# Patient Record
Sex: Female | Born: 2000 | Race: White | Hispanic: No | Marital: Single | Smoking: Never smoker
Health system: Southern US, Community
[De-identification: ages and names within clinical notes are randomized; demographics above are authoritative.]

## PROBLEM LIST (undated history)

## (undated) DIAGNOSIS — T8484XA Pain due to internal orthopedic prosthetic devices, implants and grafts, initial encounter: Secondary | ICD-10-CM

## (undated) DIAGNOSIS — T8859XA Other complications of anesthesia, initial encounter: Secondary | ICD-10-CM

## (undated) DIAGNOSIS — E221 Hyperprolactinemia: Secondary | ICD-10-CM

## (undated) DIAGNOSIS — R0981 Nasal congestion: Secondary | ICD-10-CM

## (undated) DIAGNOSIS — T4145XA Adverse effect of unspecified anesthetic, initial encounter: Secondary | ICD-10-CM

## (undated) DIAGNOSIS — Z8489 Family history of other specified conditions: Secondary | ICD-10-CM

## (undated) HISTORY — DX: Hyperprolactinemia: E22.1

## (undated) HISTORY — PX: TYMPANOSTOMY TUBE PLACEMENT: SHX32

---
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
## 2019-07-27 IMAGING — CT CT HEAD W/O CM
3 series · 16 of 47 positions shown, 19 images · non-contrast
Comparison: 01/13/06

CLINICAL DATA: Headache.  Sensitivity to light

EXAM:
CT HEAD WITHOUT CONTRAST
TECHNIQUE: Contiguous axial images were obtained from the base of the skull
through the vertex without intravenous contrast.

[Series 2: head wo · axial · 0.40mm/px · z∈[+1196,+1321]mm · 10 of 31 slices shown, 13 images]
[im 3/31  brain]
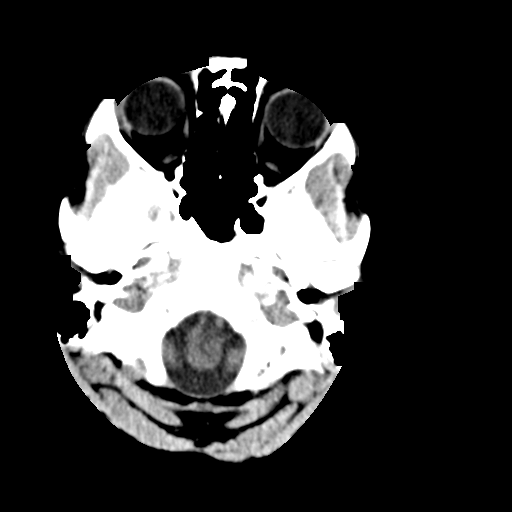
[im 3/31  bone]
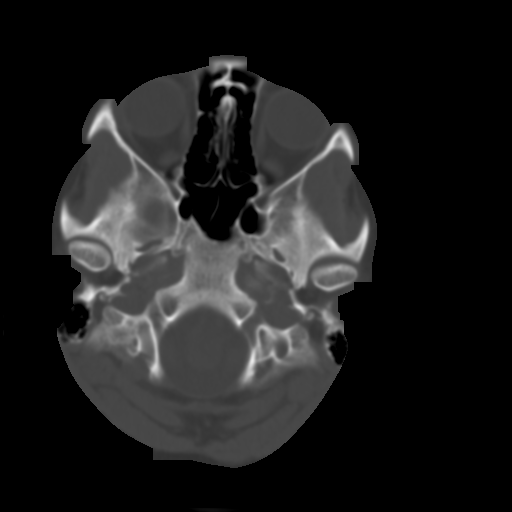
[im 6/31  brain]
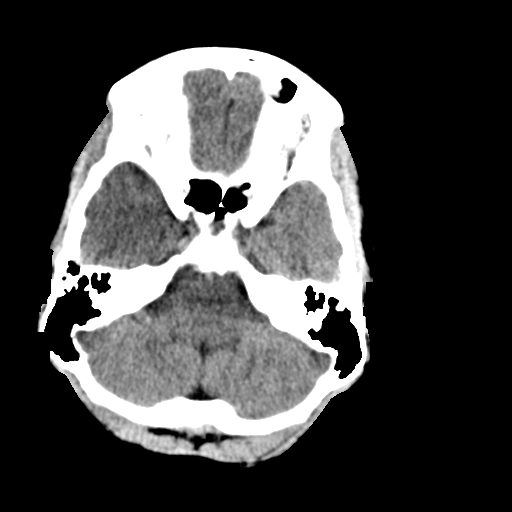
[im 9/31  brain]
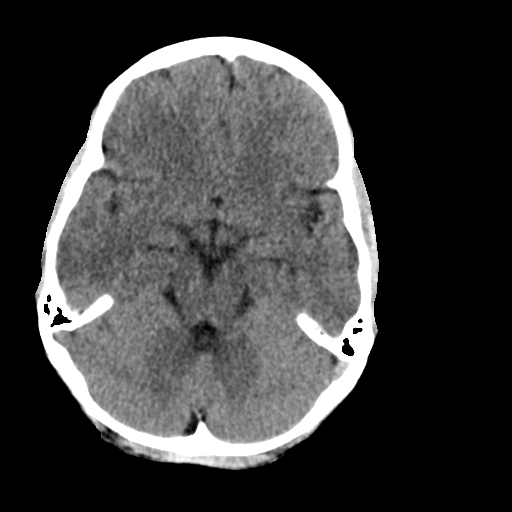
[im 11/31  brain]
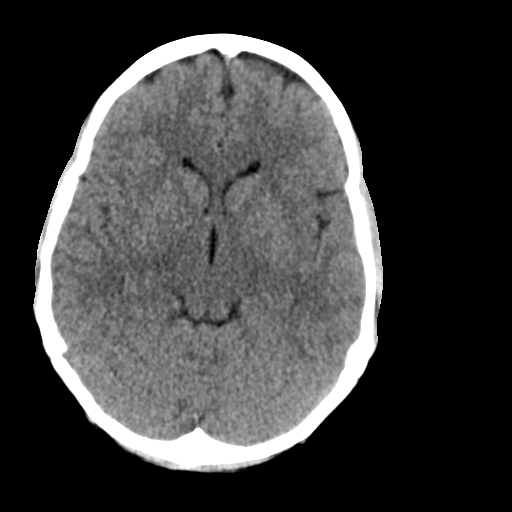
[im 14/31  brain]
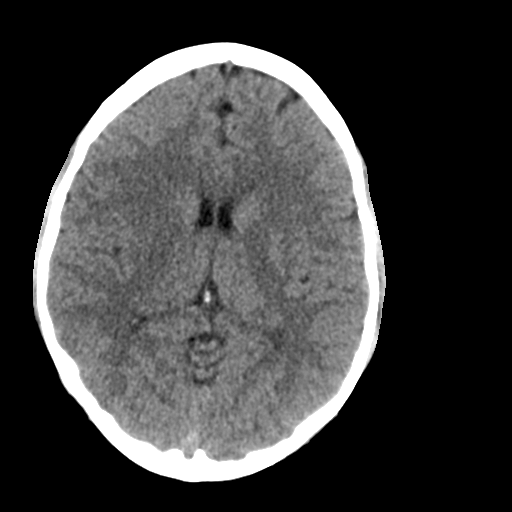
[im 14/31  bone]
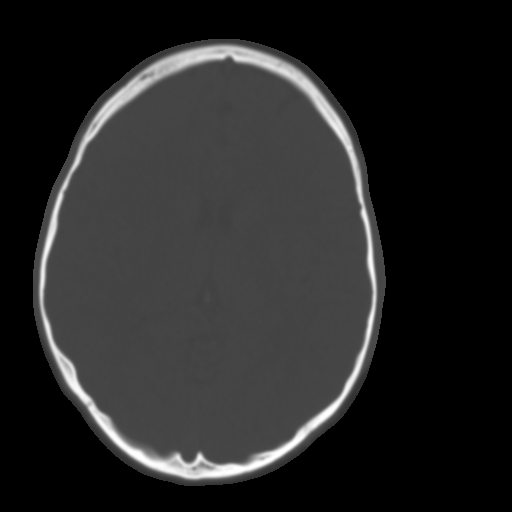
[im 17/31  brain]
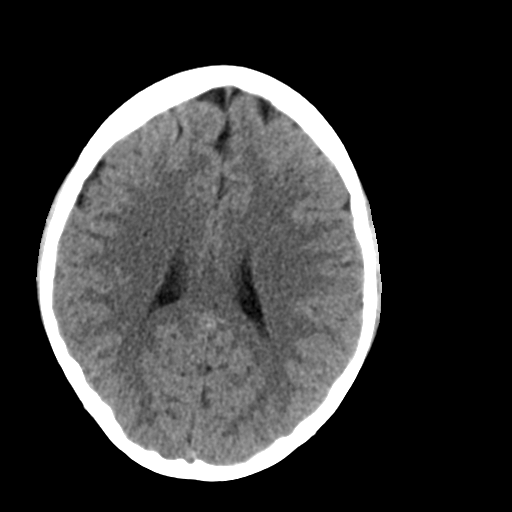
[im 20/31  brain]
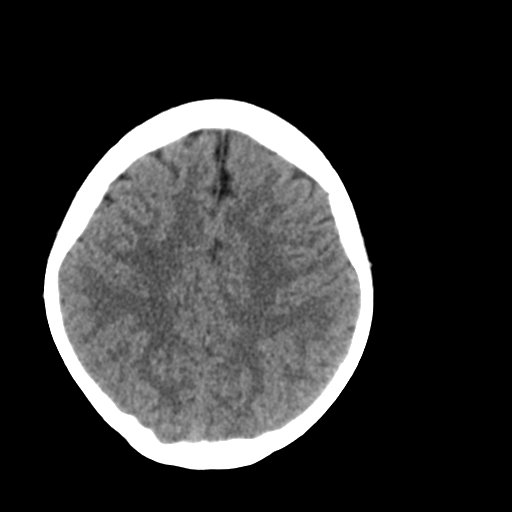
[im 23/31  brain]
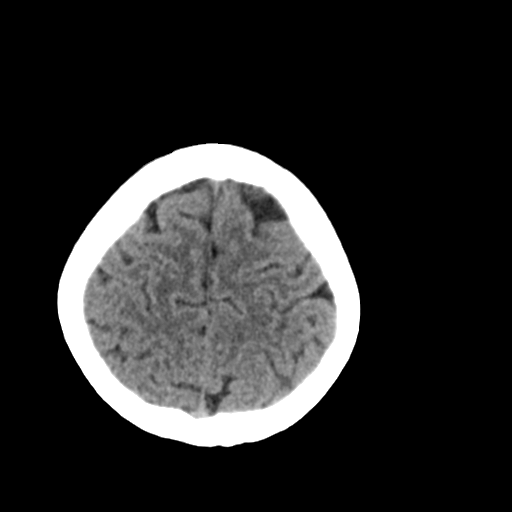
[im 25/31  brain]
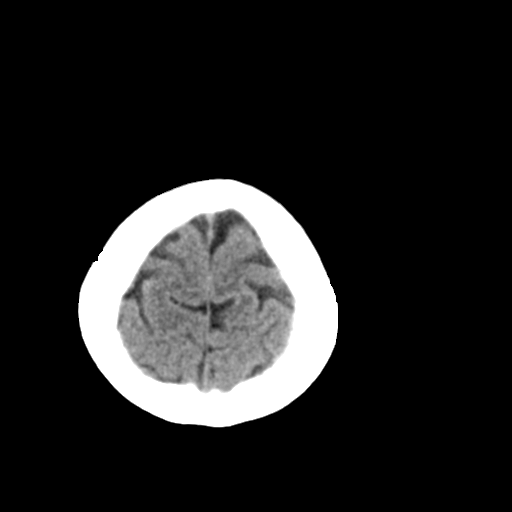
[im 25/31  bone]
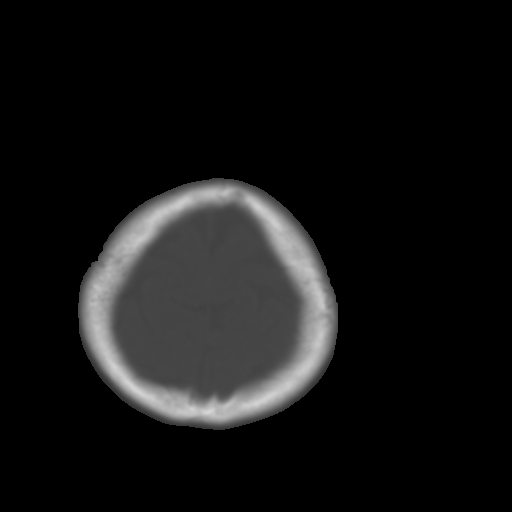
[im 28/31  brain]
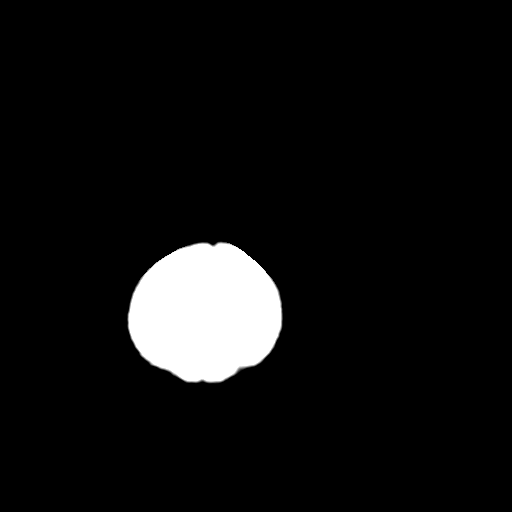

[Series 4: coronal soft tissue · coronal · 0.31mm/px · 3 of 67 slices shown]
[im 23/67  brain]
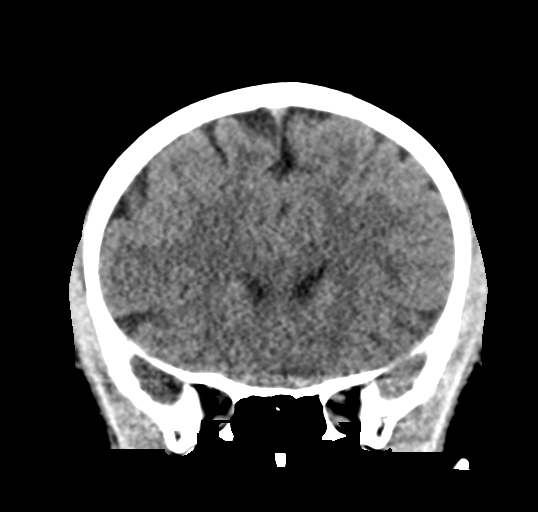
[im 30/67  brain]
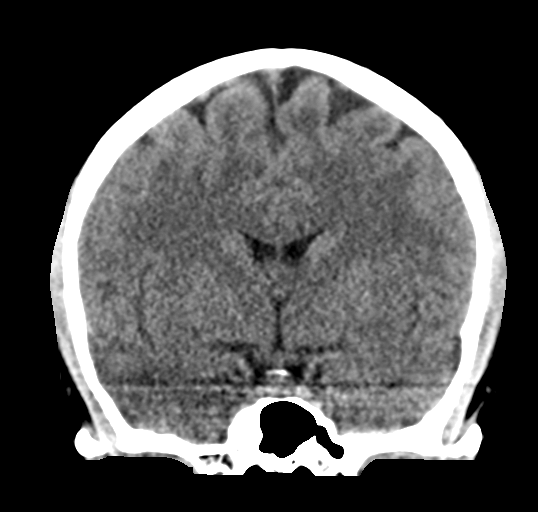
[im 37/67  brain]
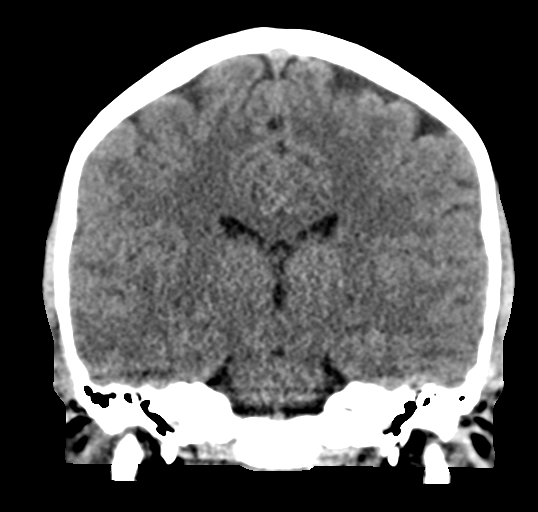

[Series 5: sagittal soft tissue · sagittal · 0.29mm/px · 3 of 57 slices shown]
[im 19/57  brain]
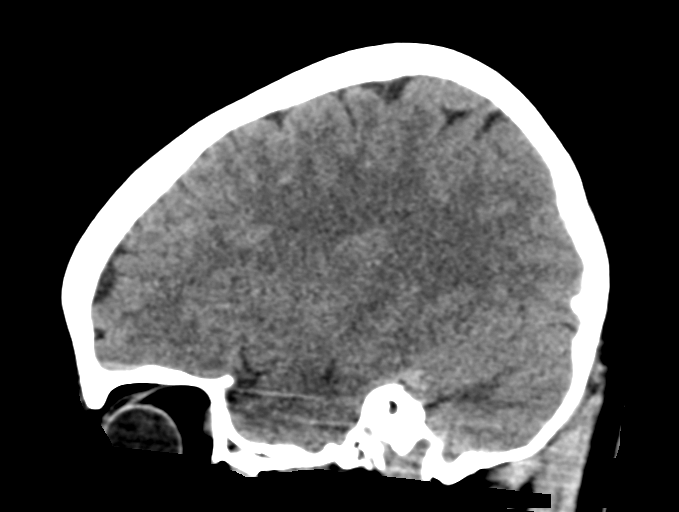
[im 29/57  brain]
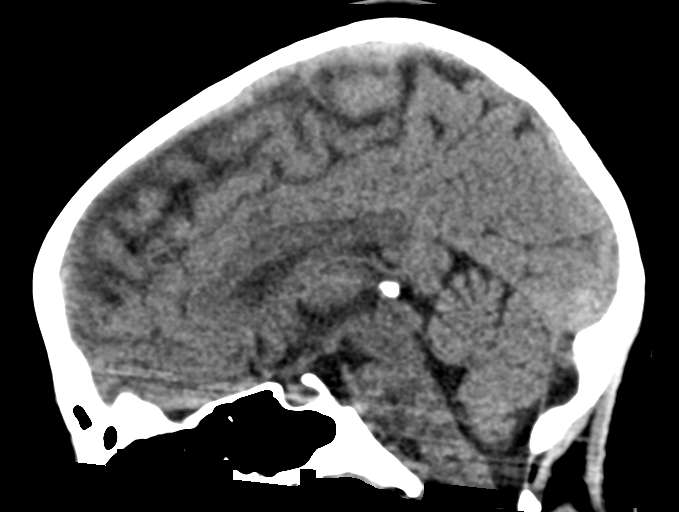
[im 38/57  brain]
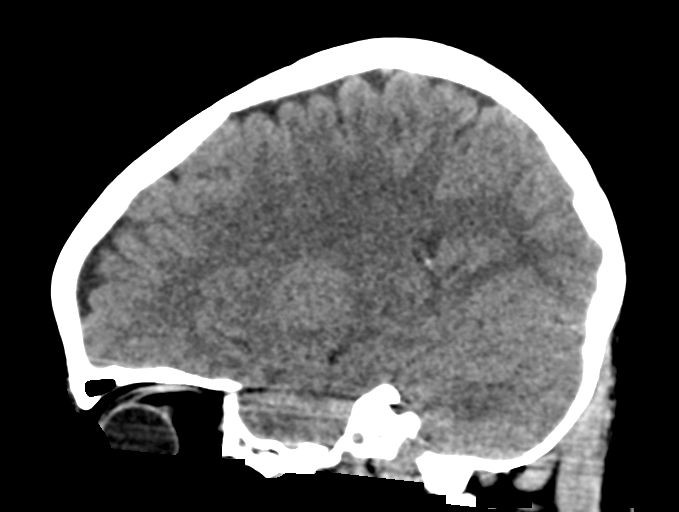

[16 of 47 positions shown; findings below may reference images not displayed]

FINDINGS: Brain: No evidence of acute infarction, hemorrhage, hydrocephalus,
extra-axial collection or mass lesion/mass effect.

Vascular: No hyperdense vessel or unexpected calcification.

Skull: Normal. Negative for fracture or focal lesion.

Sinuses/Orbits: No acute finding.

Other: None.
IMPRESSION: Normal brain.

## 2020-01-17 ENCOUNTER — Other Ambulatory Visit: Payer: Self-pay

## 2020-01-17 ENCOUNTER — Encounter (HOSPITAL_COMMUNITY): Payer: Self-pay

## 2020-01-17 ENCOUNTER — Emergency Department (HOSPITAL_COMMUNITY): Payer: No Typology Code available for payment source

## 2020-01-17 DIAGNOSIS — Z79899 Other long term (current) drug therapy: Secondary | ICD-10-CM | POA: Insufficient documentation

## 2020-01-17 DIAGNOSIS — R05 Cough: Secondary | ICD-10-CM | POA: Diagnosis not present

## 2020-01-17 DIAGNOSIS — R3912 Poor urinary stream: Secondary | ICD-10-CM | POA: Insufficient documentation

## 2020-01-17 DIAGNOSIS — R519 Headache, unspecified: Secondary | ICD-10-CM | POA: Diagnosis not present

## 2020-01-17 DIAGNOSIS — R091 Pleurisy: Secondary | ICD-10-CM | POA: Insufficient documentation

## 2020-01-17 DIAGNOSIS — R42 Dizziness and giddiness: Secondary | ICD-10-CM | POA: Insufficient documentation

## 2020-01-17 DIAGNOSIS — R0602 Shortness of breath: Secondary | ICD-10-CM | POA: Diagnosis present

## 2020-01-17 NOTE — ED Triage Notes (Signed)
Pt dx with covid last Tuesday. C/o shob

## 2020-01-18 ENCOUNTER — Emergency Department (HOSPITAL_COMMUNITY)
Admission: EM | Admit: 2020-01-18 | Discharge: 2020-01-18 | Disposition: A | Discharge status: Home / Self Care | Payer: No Typology Code available for payment source | Attending: Emergency Medicine | Admitting: Emergency Medicine

## 2020-01-18 DIAGNOSIS — R091 Pleurisy: Secondary | ICD-10-CM

## 2020-01-18 MED ORDER — PREDNISONE 20 MG PO TABS: ORAL_TABLET | ORAL | 0 refills | Status: DC

## 2020-01-18 MED ORDER — AEROCHAMBER PLUS FLO-VU MISC
1.0000 | Freq: Once | Status: AC
  Administered 2020-01-18: 1
  Filled 2020-01-18: qty 1

## 2020-01-18 MED ORDER — ALBUTEROL SULFATE HFA 108 (90 BASE) MCG/ACT IN AERS
4.0000 | INHALATION_SPRAY | Freq: Once | RESPIRATORY_TRACT | Status: AC
  Administered 2020-01-18: 4 via RESPIRATORY_TRACT
  Filled 2020-01-18: qty 6.7

## 2020-01-18 MED ORDER — PREDNISONE 20 MG PO TABS
60.0000 mg | ORAL_TABLET | Freq: Once | ORAL | Status: AC
  Administered 2020-01-18: 60 mg via ORAL
  Filled 2020-01-18: qty 3

## 2020-01-18 MED ORDER — KETOROLAC TROMETHAMINE 30 MG/ML IJ SOLN
15.0000 mg | Freq: Once | INTRAMUSCULAR | Status: AC
  Administered 2020-01-18: 15 mg via INTRAVENOUS
  Filled 2020-01-18: qty 1

## 2020-01-18 MED ORDER — LACTATED RINGERS IV BOLUS
1000.0000 mL | Freq: Once | INTRAVENOUS | Status: AC
  Administered 2020-01-18: 1000 mL via INTRAVENOUS

## 2020-01-18 NOTE — ED Provider Notes (Signed)
[facility]-EMERGENCY DEPT Provider Note   CSN: [id number] Arrival date & time: 01/17/20  2213     History Chief Complaint  Patient presents with  . Shortness of Breath    Sierra Olson is a 19 y.o. female.  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  At times she has had a headache.  Since that time she developed some decreased intake, lightheadedness.  Decreased urine output.  Patient just does not feel like eating she states.  No diarrhea or constipation.  No fevers today but has been as high as 102-103 since her illness.  Has some mild anterior midline chest pain associated with breathing.  Some cough that is nonproductive.     Past Medical History:  Diagnosis Date  . Complication of anesthesia    hard to wake up post-op  . Family history of adverse reaction to anesthesia    pt's mother has hx. of being hard to wake up post-op  . Painful orthopaedic hardware (HCC) 05/2017   left foot  . Stuffy nose 06/19/2017    There are no problems to display for this patient.   Past Surgical History:  Procedure Laterality Date  . BUNIONECTOMY Left 2009  . HARDWARE REMOVAL Left 06/26/2017   Procedure: HARDWARE REMOVAL left foot;  Surgeon: Toni Arthurs, MD;  Location: [facility];  Service: Orthopedics;  Laterality: Left;  . TYMPANOSTOMY TUBE PLACEMENT Bilateral    age 44     OB History   No obstetric history on file.     Family History  Problem Relation Age of Onset  . Hypothyroidism Mother   . Anesthesia problems Mother        hard to wake up post-op  . Hypertension Father   . Hyperlipidemia Father   . Diabetes Maternal Grandfather   . Diabetes Paternal Grandmother   . Congestive Heart Failure Paternal Grandfather   . Hypertension Paternal Grandfather     Social History   Tobacco Use  . Smoking status: Never Smoker  . Smokeless tobacco: Never Used  Vaping Use  .  Vaping Use: Never used  Substance Use Topics  . Alcohol use: No  . Drug use: No    Home Medications Prior to Admission medications   Medication Sig Start Date End Date Taking? Authorizing Provider  cephALEXin (KEFLEX) 500 MG capsule Take 1 capsule (500 mg total) by mouth 4 (four) times daily. 07/25/17   Margarita Grizzle, MD  naproxen (NAPROSYN) 250 MG tablet Take 1 tablet (250 mg total) by mouth 2 (two) times daily with a meal. 07/23/17   Samuel Jester, DO  ondansetron (ZOFRAN ODT) 4 MG disintegrating tablet Take 1 tablet (4 mg total) by mouth every 8 (eight) hours as needed for nausea or vomiting. 07/23/17   Samuel Jester, DO  oxyCODONE-acetaminophen (PERCOCET/ROXICET) 5-325 MG tablet Take 2 tablets by mouth every 4 (four) hours as needed for severe pain. 07/25/17   Margarita Grizzle, MD  predniSONE (DELTASONE) 20 MG tablet 3 tabs po day one, then 2 po daily x 4 days 01/18/20   Errik Mitchelle, Barbara Cower, MD  Simethicone (GAS RELIEF 80 PO) Take 1-2 capsules by mouth once as needed (for gas relief).    [provider]  tamsulosin (FLOMAX) 0.4 MG CAPS capsule Take 1 capsule (0.4 mg total) by mouth daily. 07/25/17   Margarita Grizzle, MD    Allergies    Patient has no known allergies.  Review of Systems   Review of Systems  All other systems reviewed and are negative.   Physical Exam Updated Vital Signs BP 137/83 (BP Location: Right Arm)   Pulse (!) 104   Temp 98.1 F (36.7 C) (Oral)   Resp 16   Ht 5\' 5"  (1.651 m)   Wt 63.5 kg   LMP 12/16/2019   SpO2 100%   BMI 23.30 kg/m   Physical Exam Vitals and nursing note reviewed.  Constitutional:      Appearance: She is well-developed.  HENT:     Head: Normocephalic and atraumatic.  Cardiovascular:     Rate and Rhythm: Normal rate and regular rhythm.  Pulmonary:     Effort: No respiratory distress.     Breath sounds: No stridor. No decreased breath sounds or wheezing.     Comments: Coughs with deep breathing Abdominal:     General: There is  no distension.  Musculoskeletal:     Cervical back: Normal range of motion.  Skin:    General: Skin is warm and dry.  Neurological:     General: No focal deficit present.     Mental Status: She is alert.     ED Results / Procedures / Treatments   Labs (all labs ordered are listed, but only abnormal results are displayed) Labs Reviewed - No data to display  EKG None  Radiology DG Chest 2 View  Result Date: 01/17/2020 CLINICAL DATA:  Shortness of breath EXAM: CHEST - 2 VIEW COMPARISON:  None. FINDINGS: The heart size and mediastinal contours are within normal limits. Both lungs are clear. The visualized skeletal structures are unremarkable. IMPRESSION: No active cardiopulmonary disease. Electronically Signed   By: 01/19/2020 M.D.   On: 01/17/2020 23:28    Procedures Procedures (including critical care time)  Medications Ordered in ED Medications  predniSONE (DELTASONE) tablet 60 mg (60 mg Oral Given 01/18/20 0142)  albuterol (VENTOLIN HFA) 108 (90 Base) MCG/ACT inhaler 4 puff (4 puffs Inhalation Given 01/18/20 0143)  aerochamber plus with mask device 1 each (1 each Other Given 01/18/20 0143)  lactated ringers bolus 1,000 mL (1,000 mLs Intravenous New Bag/Given 01/18/20 0141)  ketorolac (TORADOL) 30 MG/ML injection 15 mg (15 mg Intravenous Given 01/18/20 0141)    ED Course  I have reviewed the triage vital signs and the nursing notes.  Pertinent labs & imaging results that were available during my care of the patient were reviewed by me and considered in my medical decision making (see chart for details).    MDM Rules/Calculators/A&P                          Will try albuterol/pred for symptoms.  Fluids for dehydration. Doubt PE, no e/o lobar pneumonia on xr. Likely covid pleuritis. Significant improvement with treatments here.  Found to have normal EKG and chest x-ray low concern for pulmonary embolus, lobar pneumonia, myocarditis or any other emergent causes  Final  Clinical Impression(s) / ED Diagnoses Final diagnoses:  Pleurisy    Rx / DC Orders ED Discharge Orders         Ordered    predniSONE (DELTASONE) 20 MG tablet        01/18/20 0328           Geriann Lafont, 01/20/20, MD 01/18/20 [phone]

## 2020-08-10 ENCOUNTER — Emergency Department (HOSPITAL_COMMUNITY): Payer: No Typology Code available for payment source

## 2020-08-10 ENCOUNTER — Other Ambulatory Visit: Payer: Self-pay

## 2020-08-10 ENCOUNTER — Emergency Department (HOSPITAL_COMMUNITY)
Admission: EM | Admit: 2020-08-10 | Discharge: 2020-08-10 | Disposition: A | Discharge status: Home / Self Care | Payer: No Typology Code available for payment source | Attending: Emergency Medicine | Admitting: Emergency Medicine

## 2020-08-10 ENCOUNTER — Encounter (HOSPITAL_COMMUNITY): Payer: Self-pay | Admitting: *Deleted

## 2020-08-10 DIAGNOSIS — S300XXA Contusion of lower back and pelvis, initial encounter: Secondary | ICD-10-CM | POA: Diagnosis not present

## 2020-08-10 DIAGNOSIS — Y9301 Activity, walking, marching and hiking: Secondary | ICD-10-CM | POA: Diagnosis not present

## 2020-08-10 DIAGNOSIS — W109XXA Fall (on) (from) unspecified stairs and steps, initial encounter: Secondary | ICD-10-CM | POA: Insufficient documentation

## 2020-08-10 DIAGNOSIS — S93402A Sprain of unspecified ligament of left ankle, initial encounter: Secondary | ICD-10-CM | POA: Insufficient documentation

## 2020-08-10 DIAGNOSIS — S99912A Unspecified injury of left ankle, initial encounter: Secondary | ICD-10-CM | POA: Diagnosis present

## 2020-08-10 MED ORDER — KETOROLAC TROMETHAMINE 60 MG/2ML IM SOLN
60.0000 mg | Freq: Once | INTRAMUSCULAR | Status: AC
  Administered 2020-08-10: 60 mg via INTRAMUSCULAR
  Filled 2020-08-10: qty 2

## 2020-08-10 NOTE — ED Triage Notes (Signed)
Larey Seat this am, pain in left ankle and foot

## 2020-08-10 NOTE — Discharge Instructions (Addendum)
Please follow-up with your primary care provider regarding today's encounter.  Follow-up with your orthopedist at [facility] for ongoing evaluation and management.  Plain films obtained here in the ED of your ankle and foot were without any fracture, dislocation, or other acute bony pathology.  However, cannot exclude tendinous or ligamentous injury.  Please wear the ASO brace and use your crutches as needed.  Weightbearing as tolerated.  I recommend continue ice and elevation of the affected foot.  Ibuprofen 600 mg every 6 hours as needed for pain control.  Return to the ED or seek immediate medical attention should you experience any new or worsening symptoms.

## 2020-08-10 NOTE — ED Provider Notes (Signed)
[facility] EMERGENCY DEPARTMENT Provider Note   CSN: [id number] Arrival date & time: 08/10/20  1615     History Chief Complaint  Patient presents with  . Ankle Pain  . Foot Pain    Sierra Olson is a 20 y.o. 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  On my examination, patient reports that this morning she was going out to walk her dog and slipped on her wet stairs and fell several steps down.  She slid down on her butt.  She states that she has a mild bruise to her left buttock, but that is not a concern.  She denies any pelvic or upper leg injury.  No back pain.  She complains solely of left ankle discomfort.  She states that she sprained it frequently and is followed by [employer].  She denies any numbness or weakness.  She states that she was ambulatory after her fall, but then at daycare she began to experience progressively worsening pain with ambulation and weightbearing.  She is complaining of pain and swelling involving her entire ankle.  She cannot exactly remember the mechanism of injury or how she twisted her ankle.  She denies any head injury, LOC, or other symptoms.  HPI     Past Medical History:  Diagnosis Date  . Complication of anesthesia    hard to wake up post-op  . Family history of adverse reaction to anesthesia    pt's mother has hx. of being hard to wake up post-op  . Painful orthopaedic hardware (HCC) 05/2017   left foot  . Stuffy nose 06/19/2017    There are no problems to display for this patient.   Past Surgical History:  Procedure Laterality Date  . BUNIONECTOMY Left 2009  . HARDWARE REMOVAL Left 06/26/2017   Procedure: HARDWARE REMOVAL left foot;  Surgeon: Toni Arthurs, MD;  Location: [facility];  Service: Orthopedics;  Laterality: Left;  . TYMPANOSTOMY TUBE PLACEMENT Bilateral    age 56     OB History   No  obstetric history on file.     Family History  Problem Relation Age of Onset  . Hypothyroidism Mother   . Anesthesia problems Mother        hard to wake up post-op  . Hypertension Father   . Hyperlipidemia Father   . Diabetes Maternal Grandfather   . Diabetes Paternal Grandmother   . Congestive Heart Failure Paternal Grandfather   . Hypertension Paternal Grandfather     Social History   Tobacco Use  . Smoking status: Never Smoker  . Smokeless tobacco: Never Used  Vaping Use  . Vaping Use: Never used  Substance Use Topics  . Alcohol use: No  . Drug use: No    Home Medications Prior to Admission medications   Medication Sig Start Date End Date Taking? Authorizing Provider  cephALEXin (KEFLEX) 500 MG capsule Take 1 capsule (500 mg total) by mouth 4 (four) times daily. 07/25/17   Margarita Grizzle, MD  naproxen (NAPROSYN) 250 MG tablet Take 1 tablet (250 mg total) by mouth 2 (two) times daily with a meal. 07/23/17   Samuel Jester, DO  ondansetron (ZOFRAN ODT) 4 MG disintegrating tablet Take 1 tablet (4 mg total) by mouth every 8 (eight) hours as needed for nausea or vomiting. 07/23/17   Samuel Jester, DO  oxyCODONE-acetaminophen (PERCOCET/ROXICET) 5-325 MG tablet Take 2 tablets by  mouth every 4 (four) hours as needed for severe pain. 07/25/17   Margarita Grizzle, MD  predniSONE (DELTASONE) 20 MG tablet 3 tabs po day one, then 2 po daily x 4 days 01/18/20   Mesner, Barbara Cower, MD  Simethicone (GAS RELIEF 80 PO) Take 1-2 capsules by mouth once as needed (for gas relief).    [provider]  tamsulosin (FLOMAX) 0.4 MG CAPS capsule Take 1 capsule (0.4 mg total) by mouth daily. 07/25/17   Margarita Grizzle, MD    Allergies    Patient has no known allergies.  Review of Systems   Review of Systems  All other systems reviewed and are negative.   Physical Exam Updated Vital Signs BP 130/84 (BP Location: Left Arm)   Pulse 96   Temp 98.3 F (36.8 C) (Oral)   Resp 18   LMP 07/25/2020    SpO2 100%   Physical Exam Vitals and nursing note reviewed. Exam conducted with a chaperone present.  Constitutional:      Appearance: Normal appearance.  HENT:     Head: Normocephalic and atraumatic.  Eyes:     General: No scleral icterus.    Conjunctiva/sclera: Conjunctivae normal.  Cardiovascular:     Rate and Rhythm: Normal rate.  Pulmonary:     Effort: Pulmonary effort is normal. No respiratory distress.  Musculoskeletal:     Comments: Right hip: Nontender.  ROM intact.  Can flex leg against resistance. Right knee: Nontender.  ROM fully intact.  No midshaft or proximal tibial or fibular tenderness. Right ankle: Swelling and tenderness involving both the lateral and medial malleolus.  The Achilles tendon is intact.  Pedal pulse intact and symmetrical contralateral foot.  No overlying skin changes.  Can wiggle toes.  Plantar flexion intact with strength.  Dorsiflexion limited due to pain symptoms.  Sensation intact throughout.  Skin:    General: Skin is dry.  Neurological:     Mental Status: She is alert and oriented to person, place, and time.     GCS: GCS eye subscore is 4. GCS verbal subscore is 5. GCS motor subscore is 6.  Psychiatric:        Mood and Affect: Mood normal.        Behavior: Behavior normal.        Thought Content: Thought content normal.     ED Results / Procedures / Treatments   Labs (all labs ordered are listed, but only abnormal results are displayed) Labs Reviewed - No data to display  EKG None  Radiology DG Ankle Complete Left  Result Date: 08/10/2020 CLINICAL DATA:  Status post trauma. EXAM: LEFT ANKLE COMPLETE - 3+ VIEW COMPARISON:  None. FINDINGS: There is no evidence of acute fracture, dislocation, or joint effusion. A chronic fracture deformity of the distal tip of the left lateral malleolus is noted. There is no evidence of arthropathy or other focal bone abnormality. Mild anterior and lateral soft tissue swelling is noted. IMPRESSION: 1.  Mild soft tissue swelling without evidence of acute fracture. Electronically Signed   By: Aram Candela M.D.   On: 08/10/2020 17:10   DG Foot Complete Left  Result Date: 08/10/2020 CLINICAL DATA:  Status post trauma. EXAM: LEFT FOOT - COMPLETE 3+ VIEW COMPARISON:  None. FINDINGS: There is no evidence of acute fracture or dislocation. A chronic fracture deformity is seen along the left lateral malleolus. There is no evidence of arthropathy or other focal bone abnormality. Mild soft tissue swelling is seen along the dorsal aspect of  the mid to distal left foot. IMPRESSION: Mild soft tissue swelling without evidence of acute fracture. Electronically Signed   By: Aram Candela M.D.   On: 08/10/2020 17:08    Procedures Procedures   Medications Ordered in ED Medications  ketorolac (TORADOL) injection 60 mg (has no administration in time range)    ED Course  I have reviewed the triage vital signs and the nursing notes.  Pertinent labs & imaging results that were available during my care of the patient were reviewed by me and considered in my medical decision making (see chart for details).    MDM Rules/Calculators/A&P                          Sierra Olson was evaluated in Emergency Department on 08/10/2020 for the symptoms described in the history of present illness. She was evaluated in the context of the global COVID-19 pandemic, which necessitated consideration that the patient might be at risk for infection with the SARS-CoV-2 virus that causes COVID-19. Institutional protocols and algorithms that pertain to the evaluation of patients at risk for COVID-19 are in a state of rapid change based on information released by regulatory bodies including the CDC and federal and state organizations. These policies and algorithms were followed during the patient's care in the ED.  I personally reviewed patient's medical chart and all notes from triage and staff during today's encounter. I  have also ordered and reviewed all labs and imaging that I felt to be medically necessary in the evaluation of this patient's complaints and with consideration of their physical exam. If needed, translation services were available and utilized.   Personally reviewed plain films which demonstrate mild soft tissue swelling, but no acute fracture, dislocation, or other osseous abnormalities.  Sprain due to mechanical injury.  Will place an ASO brace.  Offered crutches, patient declined that she has them at home.  She states that she twisted her ankle frequently.  I told her that while plain films are reassuring, cannot exclude tendinous or ligamentous injury.  I highly encouraged her to follow-up with her orthopedist at [facility] for ongoing evaluation and management.  Toradol IM administered here in the ED for pain control.  She adamantly denies possibility pregnancy.  Recommend continued ice, elevation of affected foot, and ibuprofen 600 mg 6 hours.  ED return precautions discussed.  Patient voices understanding and is agreeable to the plan.  Final Clinical Impression(s) / ED Diagnoses Final diagnoses:  Sprain of left ankle, unspecified ligament, initial encounter    Rx / DC Orders ED Discharge Orders    None       Elvera Maria 08/10/20 Lynford Citizen, MD 08/13/20 [phone]

## 2020-09-30 ENCOUNTER — Encounter: Payer: Self-pay | Admitting: Emergency Medicine

## 2020-09-30 ENCOUNTER — Other Ambulatory Visit: Payer: Self-pay

## 2020-09-30 ENCOUNTER — Ambulatory Visit
Admission: EM | Admit: 2020-09-30 | Discharge: 2020-09-30 | Disposition: A | Discharge status: Home / Self Care | Payer: No Typology Code available for payment source | Attending: Family Medicine | Admitting: Family Medicine

## 2020-09-30 DIAGNOSIS — J0140 Acute pansinusitis, unspecified: Secondary | ICD-10-CM

## 2020-09-30 DIAGNOSIS — R059 Cough, unspecified: Secondary | ICD-10-CM

## 2020-09-30 MED ORDER — PREDNISONE 20 MG PO TABS: 40.0000 mg | ORAL_TABLET | Freq: Every day | ORAL | 0 refills | Status: AC

## 2020-09-30 MED ORDER — AMOXICILLIN 875 MG PO TABS: 875.0000 mg | ORAL_TABLET | Freq: Two times a day (BID) | ORAL | 0 refills | Status: AC

## 2020-09-30 MED ORDER — PROMETHAZINE-DM 6.25-15 MG/5ML PO SYRP: 5.0000 mL | ORAL_SOLUTION | Freq: Three times a day (TID) | ORAL | 0 refills | Status: AC | PRN

## 2020-09-30 NOTE — ED Provider Notes (Signed)
[facility]    CSN: [id number] Arrival date & time: 09/30/20  4098      History   Chief Complaint Chief Complaint  Patient presents with  . Nasal Congestion    HPI Sierra Olson is a 20 y.o. female.   HPI Patient presents with URI symptoms including cough, sore throat,  nasal congestion, and runny nose. Headache and facial pressure. Afebrile. Denies worrisome symptoms of shortness of breath or weakness.  Past Medical History:  Diagnosis Date  . Complication of anesthesia    hard to wake up post-op  . Family history of adverse reaction to anesthesia    pt's mother has hx. of being hard to wake up post-op  . Painful orthopaedic hardware (HCC) 05/2017   left foot  . Stuffy nose 06/19/2017    There are no problems to display for this patient.   Past Surgical History:  Procedure Laterality Date  . BUNIONECTOMY Left 2009  . HARDWARE REMOVAL Left 06/26/2017   Procedure: HARDWARE REMOVAL left foot;  Surgeon: Toni Arthurs, MD;  Location: [facility];  Service: Orthopedics;  Laterality: Left;  . TYMPANOSTOMY TUBE PLACEMENT Bilateral    age 25    OB History   No obstetric history on file.      Home Medications    Prior to Admission medications   Medication Sig Start Date End Date Taking? Authorizing Provider  amoxicillin (AMOXIL) 875 MG tablet Take 1 tablet (875 mg total) by mouth 2 (two) times daily. 09/30/20  Yes Bing Neighbors, FNP  predniSONE (DELTASONE) 20 MG tablet Take 2 tablets (40 mg total) by mouth daily with breakfast for 5 days. 09/30/20 10/05/20 Yes Bing Neighbors, FNP  promethazine-dextromethorphan (PROMETHAZINE-DM) 6.25-15 MG/5ML syrup Take 5 mLs by mouth 3 (three) times daily as needed for cough. 09/30/20  Yes Bing Neighbors, FNP  cephALEXin (KEFLEX) 500 MG capsule Take 1 capsule (500 mg total) by mouth 4 (four) times daily. 07/25/17   Margarita Grizzle, MD  naproxen (NAPROSYN) 250 MG tablet Take 1 tablet (250 mg total) by  mouth 2 (two) times daily with a meal. 07/23/17   Samuel Jester, DO  ondansetron (ZOFRAN ODT) 4 MG disintegrating tablet Take 1 tablet (4 mg total) by mouth every 8 (eight) hours as needed for nausea or vomiting. 07/23/17   Samuel Jester, DO  oxyCODONE-acetaminophen (PERCOCET/ROXICET) 5-325 MG tablet Take 2 tablets by mouth every 4 (four) hours as needed for severe pain. 07/25/17   Margarita Grizzle, MD  Simethicone (GAS RELIEF 80 PO) Take 1-2 capsules by mouth once as needed (for gas relief).    [provider]  tamsulosin (FLOMAX) 0.4 MG CAPS capsule Take 1 capsule (0.4 mg total) by mouth daily. 07/25/17   Margarita Grizzle, MD    Family History Family History  Problem Relation Age of Onset  . Hypothyroidism Mother   . Anesthesia problems Mother        hard to wake up post-op  . Hypertension Father   . Hyperlipidemia Father   . Diabetes Maternal Grandfather   . Diabetes Paternal Grandmother   . Congestive Heart Failure Paternal Grandfather   . Hypertension Paternal Grandfather     Social History Social History   Tobacco Use  . Smoking status: Never Smoker  . Smokeless tobacco: Never Used  Vaping Use  . Vaping Use: Never used  Substance Use Topics  . Alcohol use: No  . Drug use: No     Allergies   Patient  has no known allergies.   Review of Systems Review of Systems Pertinent negatives listed in HPI   Physical Exam Triage Vital Signs ED Triage Vitals  Enc Vitals Group     BP 09/30/20 1028 127/83     Pulse Rate 09/30/20 1028 82     Resp 09/30/20 1028 18     Temp 09/30/20 1028 98.3 F (36.8 C)     Temp Source 09/30/20 1028 Oral     SpO2 09/30/20 1028 98 %     Weight --      Height --      Head Circumference --      Peak Flow --      Pain Score 09/30/20 1032 0     Pain Loc --      Pain Edu? --      Excl. in GC? --    No data found.  Updated Vital Signs BP 127/83   Pulse 82   Temp 98.3 F (36.8 C) (Oral)   Resp 18   SpO2 98%   Visual  Acuity Right Eye Distance:   Left Eye Distance:   Bilateral Distance:    Right Eye Near:   Left Eye Near:    Bilateral Near:     Physical Exam  General Appearance:    Alert, cooperative, no distress  HENT:   Normocephalic, ears normal, nares mucosal edema with congestion, rhinorrhea, oropharynx without exudate   Eyes:    PERRL, conjunctiva/corneas clear, EOM's intact       Lungs:     Clear to auscultation bilaterally, respirations unlabored  Heart:    Regular rate and rhythm  Neurologic:   Awake, alert, oriented x 3. No apparent focal neurological           defect.      UC Treatments / Results  Labs (all labs ordered are listed, but only abnormal results are displayed) Labs Reviewed - No data to display  EKG   Radiology No results found.  Procedures Procedures (including critical care time)  Medications Ordered in UC Medications - No data to display  Initial Impression / Assessment and Plan / UC Course  I have reviewed the triage vital signs and the nursing notes.  Pertinent labs & imaging results that were available during my care of the patient were reviewed by me and considered in my medical decision making (see chart for details).     Acute sinuitis and cough, treatment per discharge medications. Hydrate well with fluids. Continue antihistamines. Follow-up with PCP as needed.  Final Clinical Impressions(s) / UC Diagnoses   Final diagnoses:  Acute non-recurrent pansinusitis  Cough   Discharge Instructions   None    ED Prescriptions    Medication Sig Dispense Auth. Provider   amoxicillin (AMOXIL) 875 MG tablet Take 1 tablet (875 mg total) by mouth 2 (two) times daily. 20 tablet Bing Neighbors, FNP   predniSONE (DELTASONE) 20 MG tablet Take 2 tablets (40 mg total) by mouth daily with breakfast for 5 days. 10 tablet Bing Neighbors, FNP   promethazine-dextromethorphan (PROMETHAZINE-DM) 6.25-15 MG/5ML syrup Take 5 mLs by mouth 3 (three) times daily as  needed for cough. 120 mL Bing Neighbors, FNP     PDMP not reviewed this encounter.   Bing Neighbors, [place] 10/03/20 [phone]

## 2020-09-30 NOTE — ED Triage Notes (Signed)
Congestion x 1 1/2 weeks.  Pt feels like she has a sinus infection. Seen at pcp but would not give an abx and wanted to test her for covid but patient refused.

## 2021-05-10 ENCOUNTER — Other Ambulatory Visit: Payer: Self-pay | Admitting: Obstetrics and Gynecology

## 2021-05-10 DIAGNOSIS — E221 Hyperprolactinemia: Secondary | ICD-10-CM

## 2021-06-06 ENCOUNTER — Ambulatory Visit
Admission: RE | Admit: 2021-06-06 | Discharge: 2021-06-06 | Disposition: A | Discharge status: Home / Self Care | Payer: No Typology Code available for payment source | Source: Ambulatory Visit | Attending: Obstetrics and Gynecology | Admitting: Obstetrics and Gynecology

## 2021-06-06 ENCOUNTER — Other Ambulatory Visit: Payer: Self-pay

## 2021-06-06 DIAGNOSIS — E221 Hyperprolactinemia: Secondary | ICD-10-CM

## 2021-06-06 MED ORDER — GADOBENATE DIMEGLUMINE 529 MG/ML IV SOLN
8.0000 mL | Freq: Once | INTRAVENOUS | Status: AC | PRN
  Administered 2021-06-06: 8 mL via INTRAVENOUS

## 2023-10-23 ENCOUNTER — Ambulatory Visit (INDEPENDENT_AMBULATORY_CARE_PROVIDER_SITE_OTHER): Payer: Self-pay | Admitting: Neurology

## 2023-10-23 ENCOUNTER — Encounter: Payer: Self-pay | Admitting: Neurology

## 2023-10-23 VITALS — BP 128/75 | Ht 65.0 in | Wt 175.0 lb

## 2023-10-23 DIAGNOSIS — R0683 Snoring: Secondary | ICD-10-CM

## 2023-10-23 DIAGNOSIS — G43409 Hemiplegic migraine, not intractable, without status migrainosus: Secondary | ICD-10-CM

## 2023-10-23 DIAGNOSIS — Z9189 Other specified personal risk factors, not elsewhere classified: Secondary | ICD-10-CM

## 2023-10-23 DIAGNOSIS — Z820 Family history of epilepsy and other diseases of the nervous system: Secondary | ICD-10-CM

## 2023-10-23 DIAGNOSIS — G43909 Migraine, unspecified, not intractable, without status migrainosus: Secondary | ICD-10-CM

## 2023-10-23 DIAGNOSIS — G4719 Other hypersomnia: Secondary | ICD-10-CM

## 2023-10-23 MED ORDER — SUMATRIPTAN SUCCINATE 50 MG PO TABS: 50.0000 mg | ORAL_TABLET | ORAL | 2 refills | Status: AC | PRN

## 2023-10-23 NOTE — Patient Instructions (Addendum)
 It was nice to meet you today.  Your neurological exam is normal.   As discussed, your headaches are likely due to a combination of factors.   Here is what we discussed today and my recommendations for you:   Please remember, common headache triggers are: sleep deprivation, dehydration, overheating, stress, hypoglycemia or skipping meals and blood sugar fluctuations, excessive pain medications or excessive alcohol use or caffeine withdrawal. Some people have food triggers such as aged cheese, orange juice or chocolate, especially dark chocolate, or MSG (monosodium glutamate). Try to avoid these headache triggers as much possible. It may be helpful to keep a headache diary to figure out what makes your headaches worse or brings them on and what alleviates them. Some people report headache onset after exercise but studies have shown that regular exercise may actually prevent headaches from coming. If you have exercise-induced headaches, please make sure that you drink plenty of fluid before and after exercising and that you do not over do it and do not overheat. Please avoid taking ibuprofen  or Tylenol  daily.  Continue to limit your caffeine to 1-2 servings per day, as caffeine can drive headaches.  You had a brain MRI in 2023 which was benign, I do not believe you need another brain scan at this time.   I will order a home sleep test to look for signs of obstructive sleep apnea (aka OSA). As explained, the long-term risks and ramifications of untreated moderate to severe obstructive sleep apnea may include (but are not limited to): increased risk for cardiovascular disease, including congestive heart failure, stroke, difficult to control hypertension, treatment resistant obesity, arrhythmias, especially irregular heartbeat commonly known as A. Fib. (atrial fibrillation); even type 2 diabetes has been linked to untreated OSA.  For headache prevention, we may consider medication in the future but for now,  since you have infrequent migraines, I suggest we try you on as needed sumatriptan  (previously known as Imitrex ) 50 mg: 1 pill early on when you suspect a migraine attack come on. You may take another pill after 2 hours, no more than 2 pills in 24 hours, no more than 3 pills a week. Most people who take triptans do not have any serious side-effects. However, they can cause drowsiness (remember to not drive or use heavy machinery when drowsy), nausea, dizziness, dry mouth. Less common side effects include strange sensations, such as tightness in your chest or throat, tingling, flushing, and feelings of heaviness or pressure in areas such as the face, limbs, and chest. These in the chest can mimic heart related pain (angina) and may cause alarm, but usually these sensations are not harmful or a sign of a heart attack. However, if you develop intense chest pain or sensations of discomfort, you should stop taking your medication and consult with me or your PCP or go to the nearest urgent care facility or ER or call 911.  So long as your home sleep test is benign, we can follow-up in this clinic as needed.  Please continue to see your eye doctor on a regular basis and also reestablish with your primary care, they may be comfortable maintaining your prescription for Imitrex . Some over-the-counter supplements have been shown to be helpful for migraine prevention.  These include magnesium oxide or magnesium glycinate, Coenzyme Q10, and riboflavin, also known as vitamin B2.   I usually recommend Magnesium Oxide or Magnesium Glycinate 250 to 500 mg at bedtime. You can take Coenzyme Q10 200 to 400 mg once daily, okay  to take in AM. Vitamin B2 200 mg once or twice daily.    I would recommend that you try and add 1 supplement at a time since even over-the-counter medications and natural supplements can have side effects.   Magnesium: Magnesium (250 mg twice a day or 500 mg at bed) has a relaxant effect on smooth  muscles such as blood vessels. Individuals suffering from frequent or daily headache usually have low magnesium levels which can be increase with daily supplementation of 400-800 mg. Three trials found 40-90% average headache reduction  when used as a preventative. Magnesium also demonstrated the benefit in menstrually related migraine.  Magnesium is part of the messenger system in the serotonin cascade and it is a good muscle relaxant.  It is also useful for constipation. Good sources include nuts, whole grains, and tomatoes. Side Effects: loose stool/diarrhea   Riboflavin (vitamin B 2) 200 mg twice a day. This vitamin assists nerve cells in the production of ATP a principal energy storing molecule.  It is necessary for many chemical reactions in the body.  There have been at least 3 clinical trials of riboflavin using 400 mg per day all of which suggested that migraine frequency can be decreased.  All 3 trials showed significant improvement in over half of migraine sufferers.  The supplement is found in bread, cereal, milk, meat, and poultry.  Most Americans get more riboflavin than the recommended daily allowance, however riboflavin deficiency is not necessary for the supplements to help prevent headache. Side effects: energizing, green urine   Coenzyme Q10: This is present in almost all cells in the body and is critical component for the conversion of energy.  Recent studies have shown that a nutritional supplement of CoQ10 can reduce the frequency of migraine attacks by improving the energy production of cells as with riboflavin.  Doses of 150 mg twice a day or 300 mg in the morning have been shown to be effective.

## 2023-10-23 NOTE — Progress Notes (Signed)
 Subjective:    Patient ID: Sierra Olson is a 23 y.o. female.  HPI    Sierra Fairy, MD, PhD [facility] [address] [address] [place], [place] [address]  Dear Dr. Asencion Blacksmith,  I saw your patient, Sierra Olson, upon your kind request in my neurologic clinic today for initial consultation of her recurrent headaches, concern for migraines.  The patient is unaccompanied today.  As you know, Sierra Olson is a 23 year old female with an underlying medical history of hyperprolactinemia, irregular periods, and borderline overweight state, who reports a longstanding history of about 10 years of intermittent migraines.  Thankfully, her migraine frequency has been better since she started a low-dose oral contraceptive pill but she does have a history of complicated migraine or hemiplegic migraines several years ago.  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  Since then, she has not had any serious neurological accompaniments with her migraines but does have occasional tingling.  She is up-to-date with her eye examination and has contact lenses.  She has not found any telltale triggers.  She tries to get enough sleep, she tries to hydrate well with water, she limits her caffeine, she usually takes over-the-counter ibuprofen  for her migraines but has also trialed her mom's Imitrex she admits.  This has been very effective for her.  She has never had her own prescription through her PCP or your office for Imitrex.  She reports a family history of migraines affecting her mom and brother.  She also has a family history of sleep apnea in her grandmother.  Of note, patient snores and does feel tired during the day.  Her Epworth sleepiness score is 13 out of 24, fatigue severity score is 12 out of 63.  She has never had a sleep study.  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  Her migraines currently are once every 2 to 3 months approximately.  She has associated nausea and sometimes vomiting, also photophobia and blurry vision and spots in her vision.  She has been on low-estrogen since 2022. Her bedtime is around 9 and rise time around 4:15 AM.  She goes to the gym in the morning.  She drinks 1 cup of coffee in the morning, alcohol rarely.  She is a non-smoker. I reviewed your office note from 07/04/2023.  Of note, she had a brain MRI with and without contrast on 06/06/2021 due to hyperprolactinemia.  I reviewed the results:  IMPRESSION: Unremarkable MRI of the brain and pituitary.   In addition, I have personally and independently reviewed images through the PACS system.  Her Past Medical History Is Significant For: Past Medical History:  Diagnosis Date   Complication of anesthesia    hard to wake up post-op   Family history of adverse reaction to anesthesia    pt's mother has hx. of being hard to wake up post-op   Idiopathic hyperprolactinemia (HCC)    Painful orthopaedic hardware (HCC) 05/2017   left foot   Stuffy nose 06/19/2017    Her Past Surgical History Is Significant For: Past Surgical History:  Procedure Laterality Date   BUNIONECTOMY Left 2009   HARDWARE REMOVAL Left 06/26/2017   Procedure: HARDWARE REMOVAL left foot;  Surgeon: Amada Backer, MD;  Location:  [facility];  Service: Orthopedics;  Laterality: Left;   TYMPANOSTOMY TUBE PLACEMENT Bilateral    age 23    Her Family History Is Significant For: Family History  Problem Relation Age of Onset   Hypothyroidism Mother    Anesthesia problems Mother        hard to wake up post-op   Migraines Mother    Hypertension Father    Hyperlipidemia Father    Diabetes Maternal Grandfather    Diabetes Paternal Grandmother    Congestive Heart Failure Paternal Grandfather     Hypertension Paternal Grandfather     Her Social History Is Significant For: Social History   Socioeconomic History   Marital status: Single    Spouse name: Not on file   Number of children: Not on file   Years of education: Not on file   Highest education level: Not on file  Occupational History   Not on file  Tobacco Use   Smoking status: Never   Smokeless tobacco: Never  Vaping Use   Vaping status: Never Used  Substance and Sexual Activity   Alcohol use: No   Drug use: No   Sexual activity: Not on file  Other Topics Concern   Not on file  Social History Narrative   Pt lives with family   Pt works    Social Drivers of Corporate investment banker Strain: Not on file  Food Insecurity: Not on file  Transportation Needs: Not on file  Physical Activity: Not on file  Stress: Not on file  Social Connections: Unknown (08/14/2019)   Received from [facility]   Social Connections    Frequency of Communication with Friends and Family: Not asked    Frequency of Social Gatherings with Friends and Family: Not asked    Her Allergies Are:  No Known Allergies:   Her Current Medications Are:  Outpatient Encounter Medications as of 10/23/2023  Medication Sig   Norethindrone-Ethinyl Estradiol-Fe Biphas (LO LOESTRIN FE) 1 MG-10 MCG / 10 MCG tablet Take 1 tablet every day by oral route.   amoxicillin  (AMOXIL ) 875 MG tablet Take 1 tablet (875 mg total) by mouth 2 (two) times daily.   cephALEXin  (KEFLEX ) 500 MG capsule Take 1 capsule (500 mg total) by mouth 4 (four) times daily.   naproxen  (NAPROSYN ) 250 MG tablet Take 1 tablet (250 mg total) by mouth 2 (two) times daily with a meal.   ondansetron  (ZOFRAN  ODT) 4 MG disintegrating tablet Take 1 tablet (4 mg total) by mouth every 8 (eight) hours as needed for nausea or vomiting.   oxyCODONE -acetaminophen  (PERCOCET/ROXICET) 5-325 MG tablet Take 2 tablets by mouth every 4 (four) hours as needed for severe pain.    promethazine -dextromethorphan (PROMETHAZINE -DM) 6.25-15 MG/5ML syrup Take 5 mLs by mouth 3 (three) times daily as needed for cough.   Simethicone (GAS RELIEF 80 PO) Take 1-2 capsules by mouth once as needed (for gas relief).   tamsulosin  (FLOMAX ) 0.4 MG CAPS capsule Take 1 capsule (0.4 mg total) by mouth daily.   No facility-administered encounter medications on file as of 10/23/2023.  :   Review of Systems:  Out of a complete 14 point review of systems, all are reviewed and negative with the exception of these symptoms as listed below:  Review of Systems  Neurological:        Pt here for Migraines Pt states 1 migraine in last month  Pt states having a tension headache this am     Objective:  Neurological Exam  Physical Exam Physical Examination:   Vitals:   10/23/23 0813  BP: 128/75    General Examination: The patient is a very pleasant 23 y.o. female in no acute distress. She appears well-developed and well-nourished and well groomed.   HEENT: Normocephalic, atraumatic, pupils are slightly unequal, with right pupil about a millimeter larger than left, not a new finding per patient.  Pupils are round and reactive to light, extraocular tracking is good without limitation to gaze excursion or nystagmus noted.  No photophobia, funduscopic exam benign.  Hearing is grossly intact. Face is symmetric with normal facial animation. Speech is clear with no dysarthria noted. There is no hypophonia. There is no lip, neck/head, jaw or voice tremor. Neck is supple with full range of passive and active motion. There are no carotid bruits on auscultation. Oropharynx exam reveals: mild mouth dryness, good dental hygiene and mild airway crowding, due to small airway entry.  Tonsils on the smaller side, Mallampati class I.  Tongue protrudes centrally and palate elevates symmetrically.  Chest: Clear to auscultation without wheezing, rhonchi or crackles noted.  Heart: S1+S2+0, regular and normal without  murmurs, rubs or gallops noted.   Abdomen: Soft, non-tender and non-distended.  Extremities: There is no pitting edema in the distal lower extremities bilaterally.   Skin: Warm and dry without trophic changes noted.   Musculoskeletal: exam reveals no obvious joint deformities.   Neurologically:  Mental status: The patient is awake, alert and oriented in all 4 spheres. Her immediate and remote memory, attention, language skills and fund of knowledge are appropriate. There is no evidence of aphasia, agnosia, apraxia or anomia. Speech is clear with normal prosody and enunciation. Thought process is linear. Mood is normal and affect is normal.  Cranial nerves II - XII are as described above under HEENT exam.  Motor exam: Normal bulk, strength and tone is noted. There is no obvious action or resting tremor.  No drift or rebound, no postural or intention tremor. Fine motor skills and coordination: intact finger taps, hand movements and rapid alternating patterns in both upper extremities, normal foot agility bilaterally.  Cerebellar testing: No dysmetria or intention tremor. There is no truncal or gait ataxia.  Normal finger-to-nose, normal heel-to-shin bilaterally. Sensory exam: intact to light touch in the upper and lower extremities. Romberg negative. Reflexes 1-2+ throughout, toes are downgoing bilaterally. Gait, station and balance: She stands easily. No veering to one side is noted. No leaning to one side is noted. Posture is age-appropriate and stance is narrow based. Gait shows normal stride length and normal pace. No problems turning are noted.  Normal tandem walk.  Assessment and Plan:  In summary, Sierra Olson is a very pleasant 23 y.o.-year old female with an underlying medical history of hyperprolactinemia, irregular periods, and borderline overweight state, who presents for evaluation of her migraines of approximately 11 years duration.  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  Her migraine frequency has decreased since starting estrogen oral contraceptive pill.  She is doing well at this time but does need some additional help with as needed medication.  She has tried her mom's Imitrex which she is discouraged from using, however, since she has done well with sumatriptan I suggested a prescription for her for as needed use.  We talked about headache triggers, we also talked about sleep disturbances today, this was an extended visit of over 60 minutes,  addressing multiple issues, comprehensive chart review involved, as well as considerable counseling and coordination of care.  Below is a summary of my recommendations and our discussion points based on today's visit.  She was given these instructions verbally during the visit and also in her after visit summary which she can access electronically.    << Please remember, common headache triggers are: sleep deprivation, dehydration, overheating, stress, hypoglycemia or skipping meals and blood sugar fluctuations, excessive pain medications or excessive alcohol use or caffeine withdrawal. Some people have food triggers such as aged cheese, orange juice or chocolate, especially dark chocolate, or MSG (monosodium glutamate). Try to avoid these headache triggers as much possible. It may be helpful to keep a headache diary to figure out what makes your headaches worse or brings them on and what alleviates them. Some people report headache onset after exercise but studies have shown that regular exercise may actually prevent headaches from coming. If you have exercise-induced headaches, please make sure that you drink plenty of fluid before and after exercising and that you do not over do it and do not overheat. Please avoid taking ibuprofen  or Tylenol  daily.  Continue to limit your caffeine to 1-2 servings per day, as caffeine can drive headaches.  You  had a brain MRI in 2023 which was benign, I do not believe you need another brain scan at this time.   I will order a home sleep test to look for signs of obstructive sleep apnea (aka OSA). As explained, the long-term risks and ramifications of untreated moderate to severe obstructive sleep apnea may include (but are not limited to): increased risk for cardiovascular disease, including congestive heart failure, stroke, difficult to control hypertension, treatment resistant obesity, arrhythmias, especially irregular heartbeat commonly known as A. Fib. (atrial fibrillation); even type 2 diabetes has been linked to untreated OSA.  For headache prevention, we may consider medication in the future but for now, since you have infrequent migraines, I suggest we try you on as needed sumatriptan  (previously known as Imitrex ) 50 mg: 1 pill early on when you suspect a migraine attack come on. You may take another pill after 2 hours, no more than 2 pills in 24 hours, no more than 3 pills a week. Most people who take triptans do not have any serious side-effects. However, they can cause drowsiness (remember to not drive or use heavy machinery when drowsy), nausea, dizziness, dry mouth. Less common side effects include strange sensations, such as tightness in your chest or throat, tingling, flushing, and feelings of heaviness or pressure in areas such as the face, limbs, and chest. These in the chest can mimic heart related pain (angina) and may cause alarm, but usually these sensations are not harmful or a sign of a heart attack. However, if you develop intense chest pain or sensations of discomfort, you should stop taking your medication and consult with me or your PCP or go to the nearest urgent care facility or ER or call 911.  So long as your home sleep test is benign, we can follow-up in this clinic as needed.  Please continue to see your eye doctor on a regular basis and also reestablish with your primary care, they may  be comfortable maintaining your prescription for Imitrex . Some over-the-counter supplements have been shown to be helpful for migraine prevention.  These include magnesium oxide or magnesium glycinate, Coenzyme Q10, and riboflavin, also known as vitamin B2.   I usually recommend Magnesium Oxide or Magnesium Glycinate 250  to 500 mg at bedtime. You can take Coenzyme Q10 200 to 400 mg once daily, okay to take in AM. Vitamin B2 200 mg once or twice daily.    I would recommend that you try and add 1 supplement at a time since even over-the-counter medications and natural supplements can have side effects.   Magnesium: Magnesium (250 mg twice a day or 500 mg at bed) has a relaxant effect on smooth muscles such as blood vessels. Individuals suffering from frequent or daily headache usually have low magnesium levels which can be increase with daily supplementation of 400-800 mg. Three trials found 40-90% average headache reduction  when used as a preventative. Magnesium also demonstrated the benefit in menstrually related migraine.  Magnesium is part of the messenger system in the serotonin cascade and it is a good muscle relaxant.  It is also useful for constipation. Good sources include nuts, whole grains, and tomatoes. Side Effects: loose stool/diarrhea   Riboflavin (vitamin B 2) 200 mg twice a day. This vitamin assists nerve cells in the production of ATP a principal energy storing molecule.  It is necessary for many chemical reactions in the body.  There have been at least 3 clinical trials of riboflavin using 400 mg per day all of which suggested that migraine frequency can be decreased.  All 3 trials showed significant improvement in over half of migraine sufferers.  The supplement is found in bread, cereal, milk, meat, and poultry.  Most Americans get more riboflavin than the recommended daily allowance, however riboflavin deficiency is not necessary for the supplements to help prevent headache. Side  effects: energizing, green urine   Coenzyme Q10: This is present in almost all cells in the body and is critical component for the conversion of energy.  Recent studies have shown that a nutritional supplement of CoQ10 can reduce the frequency of migraine attacks by improving the energy production of cells as with riboflavin.  Doses of 150 mg twice a day or 300 mg in the morning have been shown to be effective. >>  Thank you very much for allowing me to participate in the care of this nice patient. If I can be of any further assistance to you please do not hesitate to call me at [phone].  Sincerely,   Sierra Fairy, MD, PhD

## 2023-11-21 ENCOUNTER — Ambulatory Visit (INDEPENDENT_AMBULATORY_CARE_PROVIDER_SITE_OTHER): Admitting: Neurology

## 2023-11-21 DIAGNOSIS — G4733 Obstructive sleep apnea (adult) (pediatric): Secondary | ICD-10-CM | POA: Diagnosis not present

## 2023-11-21 DIAGNOSIS — R0683 Snoring: Secondary | ICD-10-CM

## 2023-11-21 DIAGNOSIS — Z9189 Other specified personal risk factors, not elsewhere classified: Secondary | ICD-10-CM

## 2023-11-21 DIAGNOSIS — G43909 Migraine, unspecified, not intractable, without status migrainosus: Secondary | ICD-10-CM

## 2023-11-21 DIAGNOSIS — G4719 Other hypersomnia: Secondary | ICD-10-CM

## 2023-11-21 DIAGNOSIS — G43409 Hemiplegic migraine, not intractable, without status migrainosus: Secondary | ICD-10-CM

## 2023-11-21 DIAGNOSIS — Z820 Family history of epilepsy and other diseases of the nervous system: Secondary | ICD-10-CM

## 2023-12-03 NOTE — Progress Notes (Signed)
 See procedure note.

## 2023-12-05 ENCOUNTER — Ambulatory Visit: Payer: Self-pay | Admitting: Neurology

## 2023-12-05 NOTE — Procedures (Signed)
   [facility]  HOME SLEEP TEST (SANSA) REPORT (Mail-Out Device):   STUDY DATE: 11/25/2023  DOB: 30-Jul-2000  MRN: [id number]  ORDERING CLINICIAN: True Mar, MD, PhD   REFERRING CLINICIAN: Dr. Marget  CLINICAL INFORMATION/HISTORY: 23 year old female with an underlying medical history of hyperprolactinemia, irregular periods, and borderline overweight state, who reports recurrent migraines, snoring and daytime tiredness.    PATIENT'S LAST REPORTED EPWORTH SLEEPINESS SCORE (ESS): 13/24.  BMI (at the time of sleep clinic visit and/or test date): 29.1 kg/m  FINDINGS:   Study Protocol:    The SANSA single-point-of-skin-contact chest-worn sensor - an FDA cleared and DOT approved type 4 home sleep test device - measures eight physiological channels,  including blood oxygen saturation (measured via PPG [photoplethysmography]), EKG-derived heart rate, respiratory effort, chest movement (measured via accelerometer), snoring, body position, and actigraphy. The device is designed to be worn for up to 10 hours per study.   Sleep Summary:   Total Recording Time (hours, min): 8 hours, 47 min  Total Effective Sleep Time (hours, min):  7 hours, 43 min  Sleep Efficiency (%):    88%   Respiratory Indices:   Calculated sAHI (per hour):  1.3/hour         Oxygen Saturation Statistics:    Oxygen Saturation (%) Mean: 97.5%   Minimum oxygen saturation (%):                 90.5%   O2 Saturation Range (%): 90.5- 100%   Time below or at 88% saturation: 0 min   Pulse Rate Statistics:   Pulse Mean (bpm):    73/min    Pulse Range (48- 111/min)   Snoring: Intermittent, mild  IMPRESSION/DIAGNOSES:     Primary snoring     RECOMMENDATIONS:   This home sleep test does not demonstrate any significant obstructive or central sleep disordered breathing with a total AHI of less than 5/hour. Her total AHI was 1.3/hour, O2 nadir of 90.5%.  Snoring was detected, intermittently  in the mild range. Treatment with a positive airway pressure device such as AutoPap or CPAP is not indicated based on this test. Snoring may improve with avoidance of the supine sleep position and weight loss (where clinically appropriate).   For disturbing snoring, an oral appliance through dentistry or orthodontics can be considered.  Other causes of the patient's symptoms, including circadian rhythm disturbances, an underlying mood disorder, medication effect and/or an underlying medical problem cannot be ruled out based on this test. Clinical correlation is recommended.  The patient should be cautioned not to drive, work at heights, or operate dangerous or heavy equipment when tired or sleepy. Review and reiteration of good sleep hygiene measures should be pursued with any patient. The patient will be advised to follow up with her referring provider, who will be notified of the test results.   I certify that I have reviewed the raw data recording prior to the issuance of this report in accordance with the standards of the American Academy of Sleep Medicine (AASM).    INTERPRETING PHYSICIAN:   True Mar, MD, PhD Medical Director, [facility]) Diplomat, ABPN (Neurology and Sleep)   [facility] [address], [address] [place], [place] [address] [phone]

## 2023-12-09 NOTE — Telephone Encounter (Signed)
 Notified of results of sleep study.  No OSA no therapy needed. Return to pcp.
